# Patient Record
Sex: Male | Born: 1970 | Race: Asian | Hispanic: No | Marital: Married
Health system: Southern US, Community
[De-identification: ages and names within clinical notes are randomized; demographics above are authoritative.]

---

## 2020-08-29 ENCOUNTER — Other Ambulatory Visit: Payer: Self-pay

## 2020-08-29 ENCOUNTER — Emergency Department (HOSPITAL_COMMUNITY): Payer: BC Managed Care – PPO

## 2020-08-29 ENCOUNTER — Encounter (HOSPITAL_COMMUNITY): Payer: Self-pay

## 2020-08-29 ENCOUNTER — Emergency Department (HOSPITAL_COMMUNITY)
Admission: EM | Admit: 2020-08-29 | Discharge: 2020-08-30 | Disposition: A | Discharge status: Home / Self Care | Payer: BC Managed Care – PPO | Attending: Emergency Medicine | Admitting: Emergency Medicine

## 2020-08-29 DIAGNOSIS — Z20822 Contact with and (suspected) exposure to covid-19: Secondary | ICD-10-CM | POA: Insufficient documentation

## 2020-08-29 DIAGNOSIS — L03115 Cellulitis of right lower limb: Secondary | ICD-10-CM | POA: Insufficient documentation

## 2020-08-29 DIAGNOSIS — M7989 Other specified soft tissue disorders: Secondary | ICD-10-CM | POA: Diagnosis present

## 2020-08-29 LAB — CBC WITH DIFFERENTIAL/PLATELET
Abs Immature Granulocytes: 0.04 10*3/uL (ref 0.00–0.07)
Basophils Absolute: 0.1 10*3/uL (ref 0.0–0.1)
Basophils Relative: 0 %
Eosinophils Absolute: 0 10*3/uL (ref 0.0–0.5)
Eosinophils Relative: 0 %
HCT: 46 % (ref 39.0–52.0)
Hemoglobin: 15.2 g/dL (ref 13.0–17.0)
Immature Granulocytes: 0 %
Lymphocytes Relative: 12 %
Lymphs Abs: 1.3 10*3/uL (ref 0.7–4.0)
MCH: 28.6 pg (ref 26.0–34.0)
MCHC: 33 g/dL (ref 30.0–36.0)
MCV: 86.6 fL (ref 80.0–100.0)
Monocytes Absolute: 0.8 10*3/uL (ref 0.1–1.0)
Monocytes Relative: 7 %
Neutro Abs: 9.1 10*3/uL — ABNORMAL HIGH (ref 1.7–7.7)
Neutrophils Relative %: 81 %
Platelets: 275 10*3/uL (ref 150–400)
RBC: 5.31 MIL/uL (ref 4.22–5.81)
RDW: 13.2 % (ref 11.5–15.5)
WBC: 11.2 10*3/uL — ABNORMAL HIGH (ref 4.0–10.5)
nRBC: 0 % (ref 0.0–0.2)

## 2020-08-29 LAB — COMPREHENSIVE METABOLIC PANEL
ALT: 26 U/L (ref 0–44)
AST: 24 U/L (ref 15–41)
Albumin: 3.8 g/dL (ref 3.5–5.0)
Alkaline Phosphatase: 50 U/L (ref 38–126)
Anion gap: 9 (ref 5–15)
BUN: 11 mg/dL (ref 6–20)
CO2: 26 mmol/L (ref 22–32)
Calcium: 9.1 mg/dL (ref 8.9–10.3)
Chloride: 98 mmol/L (ref 98–111)
Creatinine, Ser: 0.99 mg/dL (ref 0.61–1.24)
GFR, Estimated: >60 mL/min (ref >60)
Glucose, Bld: 97 mg/dL (ref 70–99)
Potassium: 3.7 mmol/L (ref 3.5–5.1)
Sodium: 133 mmol/L — ABNORMAL LOW (ref 135–145)
Total Bilirubin: 1.1 mg/dL (ref 0.3–1.2)
Total Protein: 7.6 g/dL (ref 6.5–8.1)

## 2020-08-29 NOTE — ED Triage Notes (Signed)
Right foot swelling x 5 days and chills. Denies any recent trauma, insect bite. Denies hx of DM.

## 2020-08-30 LAB — RESP PANEL BY RT-PCR (FLU A&B, COVID) ARPGX2
Influenza A by PCR: NEGATIVE
Influenza B by PCR: NEGATIVE
SARS Coronavirus 2 by RT PCR: NEGATIVE

## 2020-08-30 LAB — SEDIMENTATION RATE: Sed Rate: 30 mm/hr — ABNORMAL HIGH (ref 0–16)

## 2020-08-30 LAB — C-REACTIVE PROTEIN: CRP: 5.6 mg/dL — ABNORMAL HIGH (ref <1.0)

## 2020-08-30 MED ORDER — DEXTROSE 5 % IV SOLN
1500.0000 mg | Freq: Once | INTRAVENOUS | Status: AC
  Administered 2020-08-30: 1500 mg via INTRAVENOUS
  Filled 2020-08-30: qty 75

## 2020-08-30 NOTE — Progress Notes (Signed)
Pharmacy Note:  [facility] for Acute Bacterial Skin and Skin Structure Infection (ABSSSI) Patients to [facility] Acute Care Discharge  Brady Alvarez is an 50 y.o. male who presented to [facility] on 08/29/2020 with an Acute Bacterial Skin and Skin Structure Infection  Inclusion criteria - Indication [x]  Moderately large skin lesion (>=75 cm2 or larger - about the size of a baseball) [x]  Cellulitis  Inclusion Criteria - at least one SIRS criteria present []  WBC > 12,000 or < 4000 []  temp >100.9 or < 96.8 []  heart rate >90[]  respiratory rate >20  Pt was to be admitted for cellulitis + fever but pt refused hospitalization.  Patient was evaluated for the following exclusion criteria and no exclusions were found  Hardware involvement, Hypotension / shock, Elevated lactate (>2) without other explanation, ram-negative infection risk factors (bites, water exposure, infection after trauma, infection after skin graft, neutropenia, burns, severe immunocompromise), necrotizing fasciitis possible or confirmed, Known or suspected osteomyelitis or septic arthritis, endocarditis, diabetic foot infection, ischemic ulcers, post-operative wound infection, perirectal infections, need for drainage in the operating room, hand or facial infections, injection drug users with a fever, bacteremia, pregnancy or breastfeeding, allergy to related antibiotics like vancomycin, known liver disease (t.bili >2x ULN or AST/ALT 3x ULN)  , PharmD, BCPS  08/30/2020, 2:03 AM Clinical Pharmacist

## 2020-08-30 NOTE — ED Provider Notes (Signed)
[facility] EMERGENCY DEPARTMENT Provider Note   CSN: [id number] Arrival date & time: 08/29/20  2037     History Chief Complaint  Patient presents with  . Foot Swelling    Hendrixx Konczal is a 50 y.o. male.  Patient presents to the emergency department for evaluation of pain and swelling of right foot.  Patient reports that symptoms have been progressive over period of about 5 days.  He has had some chills but has not documented any fevers.  Patient is unaware of any trauma to the foot.        History reviewed. No pertinent past medical history.  There are no problems to display for this patient.        History reviewed. No pertinent family history.     Home Medications Prior to Admission medications   Not on File    Allergies    Patient has no known allergies.  Review of Systems   Review of Systems  Constitutional: Positive for chills.  Skin: Positive for color change. Negative for wound.  All other systems reviewed and are negative.   Physical Exam Updated Vital Signs BP 138/88   Pulse 74   Temp 100.2 F (37.9 C) (Oral)   Resp 18   Ht 5\' 4"  (1.626 m)   Wt 72.6 kg   SpO2 97%   BMI 27.46 kg/m   Physical Exam Vitals and nursing note reviewed.  Constitutional:      General: He is not in acute distress.    Appearance: Normal appearance. He is well-developed.  HENT:     Head: Normocephalic and atraumatic.     Right Ear: Hearing normal.     Left Ear: Hearing normal.     Nose: Nose normal.  Eyes:     Conjunctiva/sclera: Conjunctivae normal.     Pupils: Pupils are equal, round, and reactive to light.  Cardiovascular:     Rate and Rhythm: Regular rhythm.     Heart sounds: S1 normal and S2 normal. No murmur heard. No friction rub. No gallop.   Pulmonary:     Effort: Pulmonary effort is normal. No respiratory distress.     Breath sounds: Normal breath sounds.  Chest:     Chest wall: No tenderness.  Abdominal:     General: Bowel  sounds are normal.     Palpations: Abdomen is soft.     Tenderness: There is no abdominal tenderness. There is no guarding or rebound. Negative signs include Murphy's sign and McBurney's sign.     Hernia: No hernia is present.  Musculoskeletal:        General: Normal range of motion.     Cervical back: Normal range of motion and neck supple.     Right foot: Swelling and tenderness present.  Skin:    General: Skin is warm and dry.     Findings: Erythema (diatal R foot and toes) present. No rash.  Neurological:     Mental Status: He is alert and oriented to person, place, and time.     GCS: GCS eye subscore is 4. GCS verbal subscore is 5. GCS motor subscore is 6.     Cranial Nerves: No cranial nerve deficit.     Sensory: No sensory deficit.     Coordination: Coordination normal.  Psychiatric:        Speech: Speech normal.        Behavior: Behavior normal.        Thought Content: Thought content normal.  ED Results / Procedures / Treatments   Labs (all labs ordered are listed, but only abnormal results are displayed) Labs Reviewed  CBC WITH DIFFERENTIAL/PLATELET - Abnormal; Notable for the following components:      Result Value   WBC 11.2 (*)    Neutro Abs 9.1 (*)    All other components within normal limits  COMPREHENSIVE METABOLIC PANEL - Abnormal; Notable for the following components:   Sodium 133 (*)    All other components within normal limits  C-REACTIVE PROTEIN - Abnormal; Notable for the following components:   CRP 5.6 (*)    All other components within normal limits  CULTURE, BLOOD (ROUTINE X 2)  CULTURE, BLOOD (ROUTINE X 2)  RESP PANEL BY RT-PCR (FLU A&B, COVID) [id number]  SEDIMENTATION RATE    EKG None  Radiology DG Foot Complete Right  Result Date: 08/29/2020 CLINICAL DATA:  Foot swelling EXAM: RIGHT FOOT COMPLETE - 3+ VIEW COMPARISON:  None. FINDINGS: There is no evidence of fracture or dislocation. There is no evidence of arthropathy or other focal  bone abnormality. Soft tissues are unremarkable. IMPRESSION: Negative. Electronically Signed   By: Jasmine Pang M.D.   On: 08/29/2020 22:33    Procedures Procedures   Medications Ordered in ED Medications - No data to display  ED Course  I have reviewed the triage vital signs and the nursing notes.  Pertinent labs & imaging results that were available during my care of the patient were reviewed by me and considered in my medical decision making (see chart for details).    MDM Rules/Calculators/A&P                          Patient presents to the emergency department for evaluation of fever, pain and swelling of right foot.  Examination the foot reveals diffuse swelling but there is erythema and warmth of the distal foot.  There is a small blister at the base of the fifth toe but no fluctuance to drain.  With his fever and infection I did recommend hospitalization.  Patient does not wish to be hospitalized at this time.  He therefore will be prescribed Dalvance IV and will therefore follow-up in infectious disease clinic to ensure improvement.  He was given return precautions.  Final Clinical Impression(s) / ED Diagnoses Final diagnoses:  Cellulitis of right lower extremity    Rx / DC Orders ED Discharge Orders    None       Vivien Barretto, Canary Brim, MD 08/30/20 0202

## 2020-09-04 LAB — CULTURE, BLOOD (ROUTINE X 2)
Culture: NO GROWTH
Special Requests: ADEQUATE

## 2022-09-17 IMAGING — CR DG FOOT COMPLETE 3+V*R*
3 series · 3 of 3 positions shown · non-contrast
Comparison: None.

CLINICAL DATA: Foot swelling

EXAM:
RIGHT FOOT COMPLETE - 3+ VIEW

[foot ap]
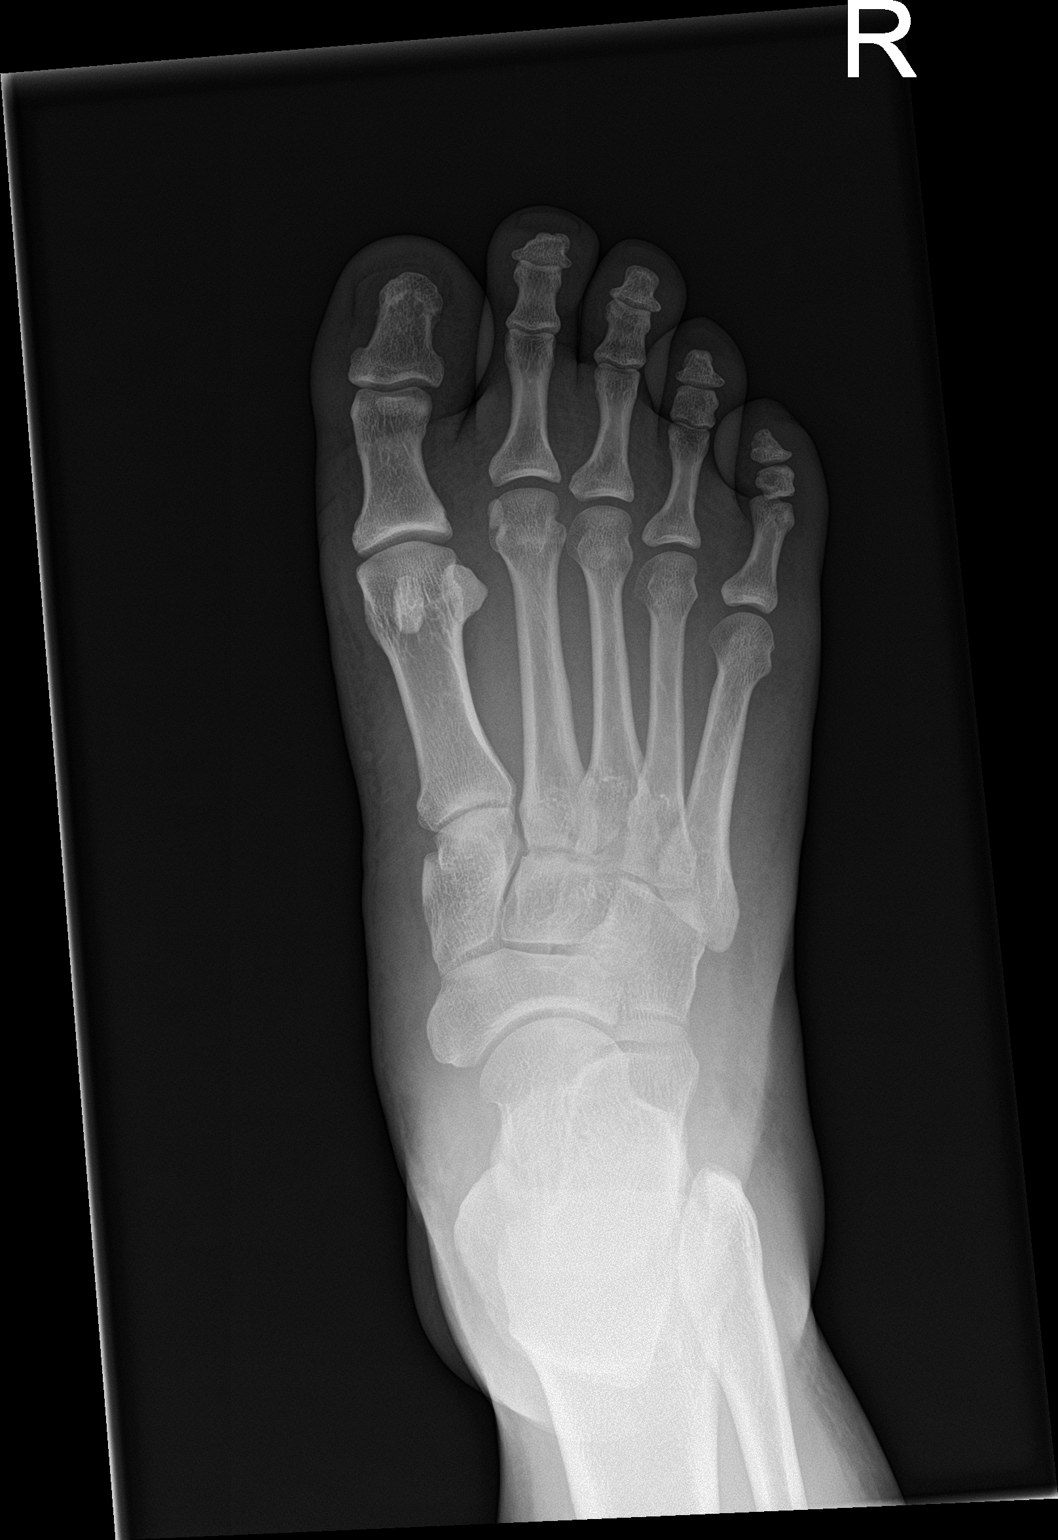

[foot obl]
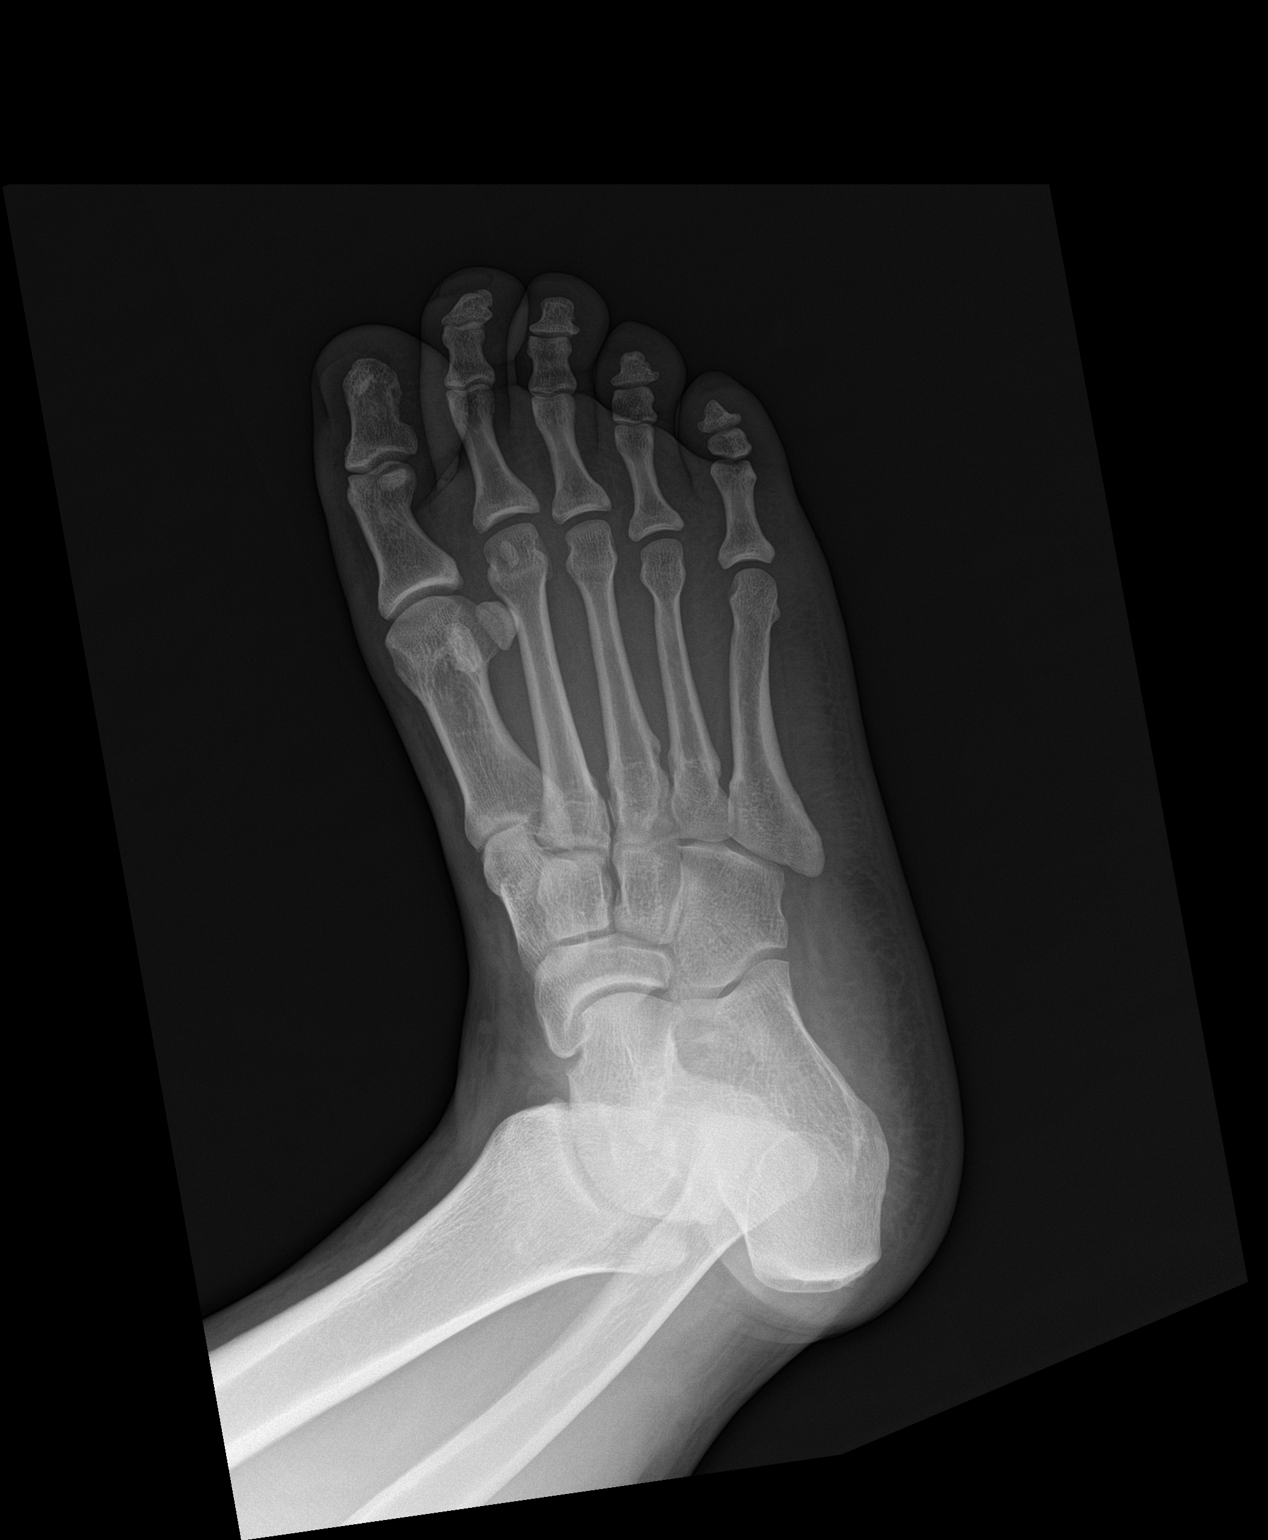

[foot lat]
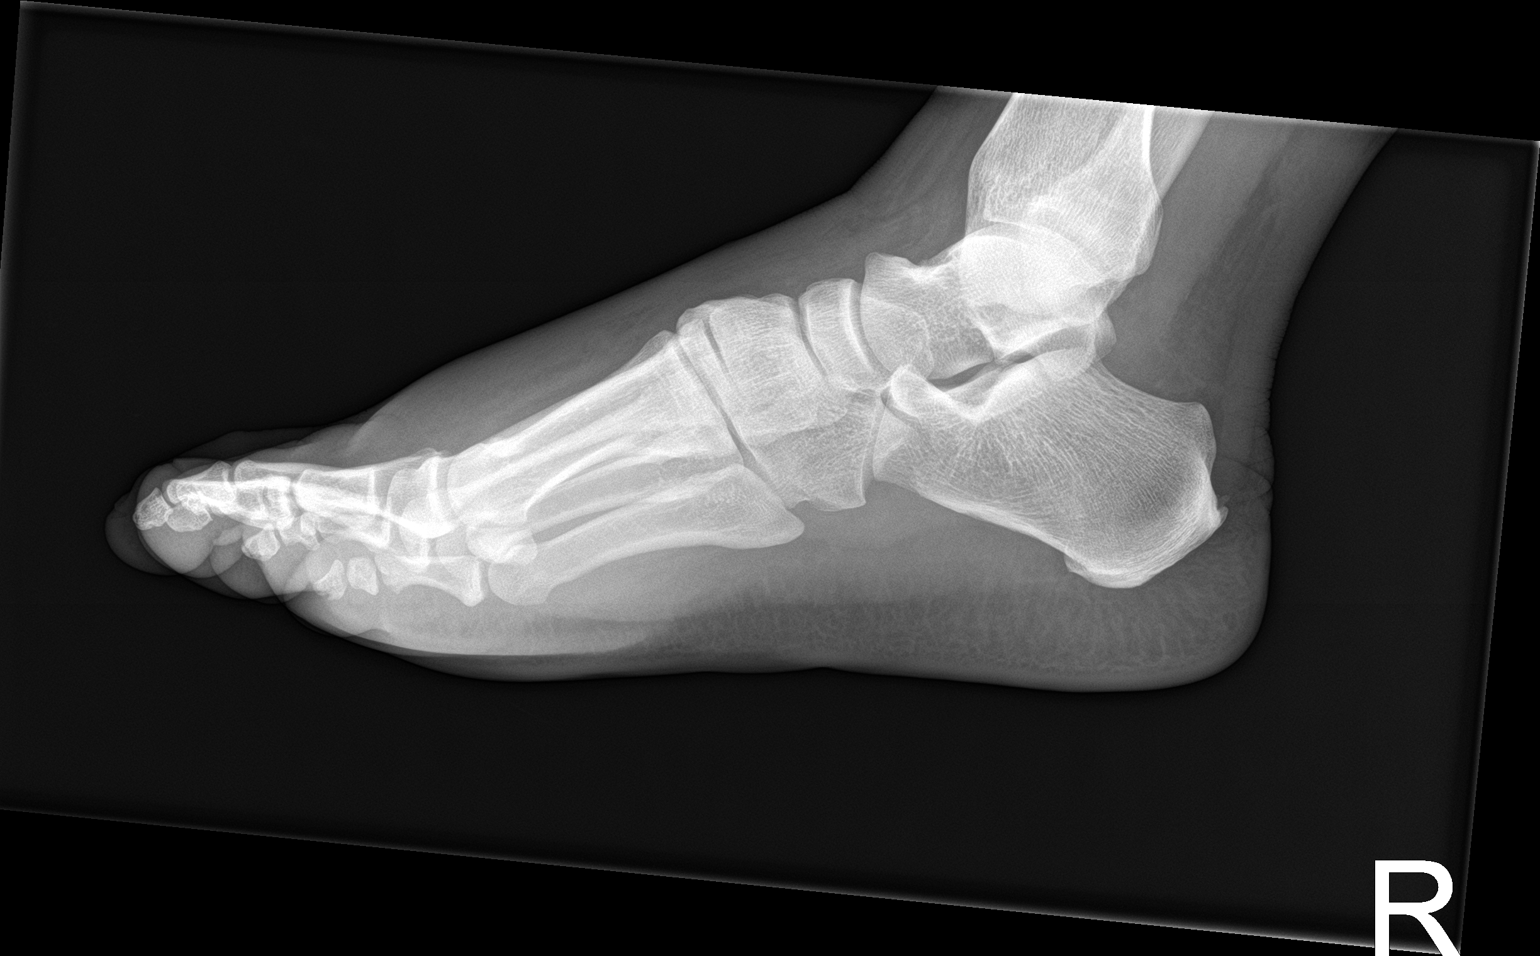

[3 of 3 positions shown; findings below may reference images not displayed]

FINDINGS: There is no evidence of fracture or dislocation. There is no
evidence of arthropathy or other focal bone abnormality. Soft
tissues are unremarkable.
IMPRESSION: Negative.
